# Patient Record
Sex: Female | Born: 1965 | Race: Black or African American | Hispanic: No | Marital: Married | Smoking: Never smoker
Health system: Southern US, Community
[De-identification: ages and names within clinical notes are randomized; demographics above are authoritative.]

## PROBLEM LIST (undated history)

## (undated) DIAGNOSIS — E042 Nontoxic multinodular goiter: Secondary | ICD-10-CM

## (undated) DIAGNOSIS — E785 Hyperlipidemia, unspecified: Secondary | ICD-10-CM

## (undated) DIAGNOSIS — I10 Essential (primary) hypertension: Secondary | ICD-10-CM

## (undated) HISTORY — DX: Hyperlipidemia, unspecified: E78.5

## (undated) HISTORY — DX: Nontoxic multinodular goiter: E04.2

## (undated) HISTORY — DX: Essential (primary) hypertension: I10

---

## 2009-09-08 HISTORY — PX: ABDOMINAL HYSTERECTOMY: SHX81

## 2010-03-18 ENCOUNTER — Ambulatory Visit: Payer: Self-pay | Admitting: Cardiology

## 2011-08-18 ENCOUNTER — Encounter: Payer: Self-pay | Admitting: Endocrinology

## 2011-08-18 ENCOUNTER — Ambulatory Visit (INDEPENDENT_AMBULATORY_CARE_PROVIDER_SITE_OTHER): Payer: 59 | Admitting: Endocrinology

## 2011-08-18 DIAGNOSIS — E042 Nontoxic multinodular goiter: Secondary | ICD-10-CM

## 2011-08-18 DIAGNOSIS — E0590 Thyrotoxicosis, unspecified without thyrotoxic crisis or storm: Secondary | ICD-10-CM

## 2011-08-18 NOTE — Patient Instructions (Signed)
let's check a thyroid "scan" (a special, but easy and painless type of thyroid x ray).  It works like this: you go to the x-ray department of the hospital to swallow a pill, which contains a miniscule amount of radiation.  You will not notice any symptoms from this.  You will go back to the x-ray department the next day, to lie down in front of a camera.  The results of this will be sent to me.  please call [phone] to hear your test results.  You will be prompted to enter the 9-digit "MRN" number that appears at the top left of this page, followed by #.  Then you will hear the message. Based on the results, i hope to order for you a treatment pill of radioactive iodine.  Although it is a larger amount of radiation, you will again notice no symptoms from this.  The pill is gone from your body in a few days (during which you should stay away from other people), but takes several months to work.  Therefore, please return here approximately 6-8 weeks after the treatment.  This treatment has been available for many years, and the only known side-effect is an underactive thyroid.  It is possible that i would eventually prescribe for you a thyroid hormone pill, which is very inexpensive.  You don't have to worry about side-effects of this thyroid hormone pill, because it is the same molecule your thyroid makes.    

## 2011-08-18 NOTE — Progress Notes (Signed)
  Subjective:    Patient ID: [name], female    DOB: 29-Oct-1965, 45 y.o.   MRN: [id number]  HPI Pt was noted in 2011 to have a goiter, in the eval of infection after ovarian surgery.  Since then, she notes slight swelling of the anterior neck, but no assoc pain.  She had a needle bx done in 2011--benign.  Past Medical History  Diagnosis Date  . Diabetes mellitus   . Nontoxic multinodular goiter   . Hyperlipidemia   . Hypertension     Past Surgical History  Procedure Date  . Abdominal hysterectomy 2011    History   Social History  . Marital Status: Married    Spouse Name: N/A    Number of Children: 2  . Years of Education: 12   Occupational History  . Not on file.   Social History Main Topics  . Smoking status: Never Smoker   . Smokeless tobacco: Not on file  . Alcohol Use: No  . Drug Use: No  . Sexually Active: Not on file   Other Topics Concern  . Not on file   Social History Narrative  . No narrative on file    No current outpatient prescriptions on file prior to visit.    No Known Allergies  Family History  Problem Relation Age of Onset  . Cancer Mother     Breast Cancer  . Cancer Father     Father-Thyroid cancer  no goiter or other thyroid probs  BP 132/88  Pulse 91  Temp(Src) 98.2 F (36.8 C) (Oral)  Ht 5\' 4"  (1.626 m)  Wt 236 lb 9.6 oz (107.321 kg)  BMI 40.61 kg/m2  SpO2 95%    Review of Systems denies hoarseness, double vision, anxiety, bruising, rhinorrhea.  She has fatigue, urinary frequency, myalgias, numbness, diarrhea, ,menopausal sxs, headache, tremor, excessive diaphoresis, weight gain, palpitations, and doe.    Objective:   Physical Exam VS: see vs page GEN: no distress HEAD: head: no deformity eyes: no periorbital swelling, no proptosis external nose and ears are normal mouth: no lesion seen NECK: supple, thyroid is not enlarged CHEST WALL: no deformity LUNGS:  Clear to auscultation CV: reg rate and rhythm, no  murmur ABD: abdomen is soft, nontender.  no hepatosplenomegaly.  not distended.  no hernia MUSCULOSKELETAL: muscle bulk and strength are grossly normal.  no obvious joint swelling.  gait is normal and steady EXTEMITIES: no deformity of the hands. no edema PULSES: dorsalis pedis intact bilat.  no carotid bruit NEURO:  cn 2-12 grossly intact.   readily moves all 4's.  sensation is intact to touch on the feet.  No tremor SKIN:  Normal texture and temperature.  No rash or suspicious lesion is visible.   NODES:  None palpable at the neck PSYCH: alert, oriented x3.  Does not appear anxious nor depressed.    outside test results are reviewed: i reviewed ultrasound result TSH=0.112    Assessment & Plan:  Multinodular goiter, which is usually hereditary Mild hyperthyroidism, due to the goiter.   Fatigue and other sxs--uncertain if thyroid-related

## 2011-08-20 ENCOUNTER — Encounter: Payer: Self-pay | Admitting: Endocrinology

## 2011-08-20 DIAGNOSIS — E785 Hyperlipidemia, unspecified: Secondary | ICD-10-CM | POA: Insufficient documentation

## 2011-08-20 DIAGNOSIS — I10 Essential (primary) hypertension: Secondary | ICD-10-CM | POA: Insufficient documentation

## 2011-09-03 ENCOUNTER — Encounter (HOSPITAL_COMMUNITY)
Admission: RE | Admit: 2011-09-03 | Discharge: 2011-09-03 | Disposition: A | Discharge status: Home / Self Care | Payer: 59 | Source: Ambulatory Visit | Attending: Endocrinology | Admitting: Endocrinology

## 2011-09-03 DIAGNOSIS — E042 Nontoxic multinodular goiter: Secondary | ICD-10-CM

## 2011-09-03 DIAGNOSIS — E0590 Thyrotoxicosis, unspecified without thyrotoxic crisis or storm: Secondary | ICD-10-CM

## 2011-09-04 ENCOUNTER — Encounter (HOSPITAL_COMMUNITY)
Admission: RE | Admit: 2011-09-04 | Discharge: 2011-09-04 | Disposition: A | Discharge status: Home / Self Care | Payer: 59 | Source: Ambulatory Visit | Attending: Endocrinology | Admitting: Endocrinology

## 2011-09-04 MED ORDER — SODIUM PERTECHNETATE TC 99M INJECTION
10.0000 | Freq: Once | INTRAVENOUS | Status: AC | PRN
  Administered 2011-09-04: 10 via INTRAVENOUS

## 2011-09-04 MED ORDER — SODIUM IODIDE I 131 CAPSULE
15.7000 | Freq: Once | INTRAVENOUS | Status: AC | PRN
  Administered 2011-09-03: 15.7 via ORAL

## 2011-09-05 ENCOUNTER — Other Ambulatory Visit: Payer: Self-pay | Admitting: Endocrinology

## 2011-09-05 DIAGNOSIS — E0590 Thyrotoxicosis, unspecified without thyrotoxic crisis or storm: Secondary | ICD-10-CM

## 2011-09-05 DIAGNOSIS — E042 Nontoxic multinodular goiter: Secondary | ICD-10-CM

## 2011-09-08 ENCOUNTER — Other Ambulatory Visit: Payer: Self-pay | Admitting: Endocrinology

## 2011-09-08 DIAGNOSIS — E0590 Thyrotoxicosis, unspecified without thyrotoxic crisis or storm: Secondary | ICD-10-CM

## 2011-09-18 ENCOUNTER — Encounter (HOSPITAL_COMMUNITY)
Admission: RE | Admit: 2011-09-18 | Discharge: 2011-09-18 | Disposition: A | Discharge status: Home / Self Care | Payer: 59 | Source: Ambulatory Visit | Attending: Endocrinology | Admitting: Endocrinology

## 2011-09-18 ENCOUNTER — Encounter (HOSPITAL_COMMUNITY): Payer: Self-pay

## 2011-09-18 DIAGNOSIS — E0520 Thyrotoxicosis with toxic multinodular goiter without thyrotoxic crisis or storm: Secondary | ICD-10-CM | POA: Insufficient documentation

## 2011-09-18 DIAGNOSIS — E0590 Thyrotoxicosis, unspecified without thyrotoxic crisis or storm: Secondary | ICD-10-CM

## 2011-09-18 MED ORDER — SODIUM IODIDE I 131 CAPSULE
31.0000 | Freq: Once | INTRAVENOUS | Status: AC | PRN
  Administered 2011-09-18: 31 via ORAL

## 2011-11-06 ENCOUNTER — Ambulatory Visit: Payer: 59 | Admitting: Endocrinology

## 2011-11-25 ENCOUNTER — Other Ambulatory Visit (INDEPENDENT_AMBULATORY_CARE_PROVIDER_SITE_OTHER): Payer: 59

## 2011-11-25 ENCOUNTER — Encounter: Payer: Self-pay | Admitting: Endocrinology

## 2011-11-25 ENCOUNTER — Ambulatory Visit (INDEPENDENT_AMBULATORY_CARE_PROVIDER_SITE_OTHER): Payer: 59 | Admitting: Endocrinology

## 2011-11-25 VITALS — BP 128/82 | HR 104 | Temp 98.4°F | Ht 64.0 in | Wt 229.2 lb

## 2011-11-25 DIAGNOSIS — E0590 Thyrotoxicosis, unspecified without thyrotoxic crisis or storm: Secondary | ICD-10-CM

## 2011-11-25 DIAGNOSIS — E042 Nontoxic multinodular goiter: Secondary | ICD-10-CM

## 2011-11-25 LAB — T4, FREE: Free T4: 1.71 ng/dL — ABNORMAL HIGH (ref 0.60–1.60)

## 2011-11-25 NOTE — Patient Instructions (Addendum)
blood tests are being requested for you today.  You will receive a letter with results. (see letter) 

## 2011-11-25 NOTE — Progress Notes (Signed)
  Subjective:    Patient ID: [name], female    DOB: February 10, 1966, 46 y.o.   MRN: [id number]  HPI Pt  Is 2 mos s/p i-131 rx for hyperthyroidism, due to multinodular goiter.  She still has heat intolerance Past Medical History  Diagnosis Date  . Nontoxic multinodular goiter   . Diabetes mellitus   . Hyperlipidemia   . Hypertension     Past Surgical History  Procedure Date  . Abdominal hysterectomy 2011    History   Social History  . Marital Status: Married    Spouse Name: N/A    Number of Children: 2  . Years of Education: 12   Occupational History  . Not on file.   Social History Main Topics  . Smoking status: Never Smoker   . Smokeless tobacco: Not on file  . Alcohol Use: No  . Drug Use: No  . Sexually Active: Not on file   Other Topics Concern  . Not on file   Social History Narrative  . No narrative on file    Current Outpatient Prescriptions on File Prior to Visit  Medication Sig Dispense Refill  . metFORMIN (GLUCOPHAGE) 500 MG tablet 3 tablets by mouth once daily       . rosuvastatin (CRESTOR) 10 MG tablet Take 10 mg by mouth daily.          No Known Allergies  Family History  Problem Relation Age of Onset  . Cancer Mother     Breast Cancer  . Cancer Father     Father-Thyroid cancer    BP 128/82  Pulse 104  Temp(Src) 98.4 F (36.9 C) (Oral)  Ht 5\' 4"  (1.626 m)  Wt 229 lb 3.2 oz (103.964 kg)  BMI 39.34 kg/m2  SpO2 95%   Review of Systems Sob is much less now    Objective:   Physical Exam VITAL SIGNS:  See vs page GENERAL: no distress eyes: bilateral proptosis Neck: 4 cm right thyroid nodule   Lab Results  Component Value Date   TSH 0.06* 11/25/2011      Assessment & Plan:  Multinodular goiter, unchanged Hyperthyroidism.  Not better yet.

## 2011-11-26 ENCOUNTER — Telehealth: Payer: Self-pay | Admitting: *Deleted

## 2011-11-26 NOTE — Telephone Encounter (Signed)
Called to inform pt of lab results-# listed does not accept calls from blocked numbers-mailed letter to pt's address on file.

## 2012-01-06 ENCOUNTER — Ambulatory Visit: Payer: 59 | Admitting: Endocrinology

## 2012-01-13 ENCOUNTER — Ambulatory Visit (INDEPENDENT_AMBULATORY_CARE_PROVIDER_SITE_OTHER): Payer: 59 | Admitting: Endocrinology

## 2012-01-13 ENCOUNTER — Encounter: Payer: Self-pay | Admitting: Endocrinology

## 2012-01-13 ENCOUNTER — Other Ambulatory Visit (INDEPENDENT_AMBULATORY_CARE_PROVIDER_SITE_OTHER): Payer: 59

## 2012-01-13 VITALS — BP 128/72 | HR 107 | Temp 97.7°F | Ht 64.0 in | Wt 228.0 lb

## 2012-01-13 DIAGNOSIS — E0590 Thyrotoxicosis, unspecified without thyrotoxic crisis or storm: Secondary | ICD-10-CM

## 2012-01-13 NOTE — Progress Notes (Signed)
  Subjective:    Patient ID: [name], female    DOB: 1966-02-04, 46 y.o.   MRN: [id number]  HPI Pt  Is 4 mos s/p i-131 rx for hyperthyroidism, due to multinodular goiter.  She has slight doe and fatigue. Past Medical History  Diagnosis Date  . Nontoxic multinodular goiter   . Diabetes mellitus   . Hyperlipidemia   . Hypertension     Past Surgical History  Procedure Date  . Abdominal hysterectomy 2011    History   Social History  . Marital Status: Married    Spouse Name: N/A    Number of Children: 2  . Years of Education: 12   Occupational History  . Not on file.   Social History Main Topics  . Smoking status: Never Smoker   . Smokeless tobacco: Not on file  . Alcohol Use: No  . Drug Use: No  . Sexually Active: Not on file   Other Topics Concern  . Not on file   Social History Narrative  . No narrative on file    Current Outpatient Prescriptions on File Prior to Visit  Medication Sig Dispense Refill  . metFORMIN (GLUCOPHAGE) 500 MG tablet 3 tablets by mouth once daily       . Olmesartan-Amlodipine-HCTZ 40-10-25 MG TABS Take 1 tablet by mouth daily.      . rosuvastatin (CRESTOR) 10 MG tablet Take 10 mg by mouth daily.          No Known Allergies  Family History  Problem Relation Age of Onset  . Cancer Mother     Breast Cancer  . Cancer Father     Father-Thyroid cancer    BP 128/72  Pulse 107  Temp(Src) 97.7 F (36.5 C) (Oral)  Ht 5\' 4"  (1.626 m)  Wt 228 lb (103.42 kg)  BMI 39.14 kg/m2  SpO2 97%  Review of Systems Denies weight change    Objective:   Physical Exam VITAL SIGNS:  See vs page GENERAL: no distress head: no deformity eyes: no periorbital swelling, but there is slight bilat proptosis. external nose and ears are normal mouth: no lesion seen Neck: 4 cm right thyroid nodule  Lab Results  Component Value Date   TSH 0.10* 01/13/2012      Assessment & Plan:  Hyperthyroidism, improved Multinodular goiter, improved.

## 2012-01-13 NOTE — Patient Instructions (Addendum)
blood tests are being requested for you today.  You will receive a letter with results. Please come back for a follow-up appointment in 2 months.  Please make an appointment. (update:  Ret as sched)

## 2012-01-14 ENCOUNTER — Encounter: Payer: Self-pay | Admitting: Endocrinology

## 2012-01-14 LAB — TSH: TSH: 0.1 u[IU]/mL — ABNORMAL LOW (ref 0.35–5.50)

## 2012-01-14 LAB — T4, FREE: Free T4: 1.02 ng/dL (ref 0.60–1.60)

## 2012-01-15 ENCOUNTER — Telehealth: Payer: Self-pay | Admitting: *Deleted

## 2012-01-15 NOTE — Telephone Encounter (Signed)
Called pt to inform of lab results. No answer/number busy. (Letter also mailed to pt)

## 2012-01-19 NOTE — Telephone Encounter (Signed)
Called pt to inform of lab results, unable to leave message-pt's # does not accept blocked calls-letter mailed to pt.

## 2012-01-19 NOTE — Telephone Encounter (Signed)
Called pt to inform of lab results, unable to leave message/no answer.

## 2012-03-16 ENCOUNTER — Other Ambulatory Visit (INDEPENDENT_AMBULATORY_CARE_PROVIDER_SITE_OTHER): Payer: 59

## 2012-03-16 ENCOUNTER — Encounter: Payer: Self-pay | Admitting: *Deleted

## 2012-03-16 ENCOUNTER — Encounter: Payer: Self-pay | Admitting: Endocrinology

## 2012-03-16 ENCOUNTER — Ambulatory Visit (INDEPENDENT_AMBULATORY_CARE_PROVIDER_SITE_OTHER): Payer: 59 | Admitting: Endocrinology

## 2012-03-16 VITALS — BP 128/80 | HR 105 | Temp 98.9°F | Ht 64.0 in | Wt 230.0 lb

## 2012-03-16 DIAGNOSIS — E0590 Thyrotoxicosis, unspecified without thyrotoxic crisis or storm: Secondary | ICD-10-CM

## 2012-03-16 NOTE — Progress Notes (Signed)
  Subjective:    Patient ID: [name], female    DOB: Feb 03, 1966, 46 y.o.   MRN: [id number]  HPI Pt  Is 6 mos s/p i-131 rx for hyperthyroidism, due to multinodular goiter.  She has slight doe and palpitations. Past Medical History  Diagnosis Date  . Nontoxic multinodular goiter   . Diabetes mellitus   . Hyperlipidemia   . Hypertension     Past Surgical History  Procedure Date  . Abdominal hysterectomy 2011    History   Social History  . Marital Status: Married    Spouse Name: N/A    Number of Children: 2  . Years of Education: 12   Occupational History  . Not on file.   Social History Main Topics  . Smoking status: Never Smoker   . Smokeless tobacco: Not on file  . Alcohol Use: No  . Drug Use: No  . Sexually Active: Not on file   Other Topics Concern  . Not on file   Social History Narrative  . No narrative on file    Current Outpatient Prescriptions on File Prior to Visit  Medication Sig Dispense Refill  . metFORMIN (GLUCOPHAGE) 500 MG tablet 3 tablets by mouth once daily       . Olmesartan-Amlodipine-HCTZ 40-10-25 MG TABS Take 1 tablet by mouth daily.      . rosuvastatin (CRESTOR) 10 MG tablet Take 10 mg by mouth daily.          No Known Allergies  Family History  Problem Relation Age of Onset  . Cancer Mother     Breast Cancer  . Cancer Father     Father-Thyroid cancer   BP 128/80  Pulse 105  Temp 98.9 F (37.2 C) (Oral)  Ht 5\' 4"  (1.626 m)  Wt 230 lb (104.327 kg)  BMI 39.48 kg/m2  SpO2 94%  Review of Systems Denies weight change    Objective:   Physical Exam head: no deformity eyes: no periorbital swelling, but there is slight bilat proptosis. external nose and ears are normal mouth: no lesion seen Neck: 4 cm right thyroid nodule.   Lab Results  Component Value Date   TSH 0.06* 03/16/2012      Assessment & Plan:  Hyperfunctioning thyroid adenoma.  It persists, anatomically and functionally, despite i-131 rx.

## 2012-03-16 NOTE — Patient Instructions (Addendum)
blood tests are being requested for you today.  You will receive a letter with results. Please come back for a follow-up appointment in 4 months.  Please make an appointment.  If your thyroid blood test is still abnormal, you should consider another radioactive iodine treatment pill. (update: i ordered rai uptake in preparation for repeat i-131 rx)

## 2012-03-17 LAB — TSH: TSH: 0.06 u[IU]/mL — ABNORMAL LOW (ref 0.35–5.50)

## 2012-03-30 ENCOUNTER — Other Ambulatory Visit: Payer: Self-pay | Admitting: Endocrinology

## 2012-03-30 DIAGNOSIS — E0590 Thyrotoxicosis, unspecified without thyrotoxic crisis or storm: Secondary | ICD-10-CM

## 2012-04-01 ENCOUNTER — Ambulatory Visit (HOSPITAL_COMMUNITY): Payer: 59

## 2012-04-02 ENCOUNTER — Other Ambulatory Visit (HOSPITAL_COMMUNITY): Payer: 59

## 2012-07-01 ENCOUNTER — Other Ambulatory Visit: Payer: Self-pay | Admitting: Endocrinology

## 2012-07-01 DIAGNOSIS — E0590 Thyrotoxicosis, unspecified without thyrotoxic crisis or storm: Secondary | ICD-10-CM

## 2012-07-13 ENCOUNTER — Ambulatory Visit (INDEPENDENT_AMBULATORY_CARE_PROVIDER_SITE_OTHER): Payer: 59 | Admitting: Endocrinology

## 2012-07-13 ENCOUNTER — Encounter: Payer: Self-pay | Admitting: Endocrinology

## 2012-07-13 VITALS — BP 126/82 | HR 78 | Temp 97.8°F | Wt 230.0 lb

## 2012-07-13 DIAGNOSIS — E0590 Thyrotoxicosis, unspecified without thyrotoxic crisis or storm: Secondary | ICD-10-CM

## 2012-07-13 NOTE — Patient Instructions (Addendum)
let's recheck the thyroid "scan" (a special, but easy and painless type of thyroid x ray).  It works like this: you go to the x-ray department of the hospital to swallow a pill, which contains a miniscule amount of radiation.  You will not notice any symptoms from this.  You will go back to the x-ray department the next day, to lie down in front of a camera.  The results of this will be sent to me.   Based on the results, i hope to order for you a treatment pill of radioactive iodine.  Although it is a larger amount of radiation, you will again notice no symptoms from this.  The pill is gone from your body in a few days (during which you should stay away from other people), but takes several months to work.  Therefore, please return here approximately 6-8 weeks after the treatment.  This treatment has been available for many years, and the only known side-effect is an underactive thyroid.  It is possible that i would eventually prescribe for you a thyroid hormone pill, which is very inexpensive.  You don't have to worry about side-effects of this thyroid hormone pill, because it is the same molecule your thyroid makes.

## 2012-07-13 NOTE — Progress Notes (Signed)
  Subjective:    Patient ID: [name], female    DOB: 02-22-1966, 46 y.o.   MRN: [id number]  HPI Pt  Is 10 mos s/p i-131 rx for hyperthyroidism, due to multinodular goiter.  sxs of doe and excessive diaphoresis persist.  nuc med scan was ordered a few mos ago, but pt says she was not called.  In our contact info, we have her home ph#, but not her cell phone. Past Medical History  Diagnosis Date  . Nontoxic multinodular goiter   . Diabetes mellitus   . Hyperlipidemia   . Hypertension     Past Surgical History  Procedure Date  . Abdominal hysterectomy 2011    History   Social History  . Marital Status: Married    Spouse Name: N/A    Number of Children: 2  . Years of Education: 12   Occupational History  . Not on file.   Social History Main Topics  . Smoking status: Never Smoker   . Smokeless tobacco: Not on file  . Alcohol Use: No  . Drug Use: No  . Sexually Active: Not on file   Other Topics Concern  . Not on file   Social History Narrative  . No narrative on file    Current Outpatient Prescriptions on File Prior to Visit  Medication Sig Dispense Refill  . Liraglutide (VICTOZA [place]) Inject into the skin.      . metFORMIN (GLUCOPHAGE) 500 MG tablet 3 tablets by mouth once daily       . Olmesartan-Amlodipine-HCTZ 40-10-25 MG TABS Take 1 tablet by mouth daily.      . rosuvastatin (CRESTOR) 10 MG tablet Take 10 mg by mouth daily.          No Known Allergies  Family History  Problem Relation Age of Onset  . Cancer Mother     Breast Cancer  . Cancer Father     Father-Thyroid cancer    BP 126/82  Pulse 78  Temp 97.8 F (36.6 C) (Oral)  Wt 230 lb (104.327 kg)  SpO2 94%   Review of Systems Denies fever    Objective:   Physical Exam VITAL SIGNS:  See vs page GENERAL: no distress Neck: thyroid is 5-10x normal size overall, with multinodular surface.  Most prominent is a 4 cm right thyroid nodule.      Assessment & Plan:  Hyperthyroidism,  persistent after i-131 rx.  i have added pt's cell phone to her contact info.

## 2012-07-14 ENCOUNTER — Telehealth: Payer: Self-pay | Admitting: *Deleted

## 2012-07-14 NOTE — Telephone Encounter (Signed)
Called pt to let her know that I spoke with Nuclear Medicine at [facility] about her appointments next week on 11/12 and 11/13. Told pt the first day she will go in to swallow a pill and then you will return the next day to have the imaging done of her thyroid. Pt understood.

## 2012-07-20 ENCOUNTER — Ambulatory Visit (HOSPITAL_COMMUNITY)
Admission: RE | Admit: 2012-07-20 | Discharge: 2012-07-20 | Disposition: A | Discharge status: Home / Self Care | Payer: 59 | Source: Ambulatory Visit | Attending: Endocrinology | Admitting: Endocrinology

## 2012-07-20 DIAGNOSIS — E0590 Thyrotoxicosis, unspecified without thyrotoxic crisis or storm: Secondary | ICD-10-CM

## 2012-07-21 ENCOUNTER — Other Ambulatory Visit: Payer: Self-pay | Admitting: Endocrinology

## 2012-07-21 ENCOUNTER — Encounter (HOSPITAL_COMMUNITY)
Admission: RE | Admit: 2012-07-21 | Discharge: 2012-07-21 | Disposition: A | Discharge status: Home / Self Care | Payer: 59 | Source: Ambulatory Visit | Attending: Endocrinology | Admitting: Endocrinology

## 2012-07-21 DIAGNOSIS — E042 Nontoxic multinodular goiter: Secondary | ICD-10-CM | POA: Insufficient documentation

## 2012-07-21 DIAGNOSIS — E0590 Thyrotoxicosis, unspecified without thyrotoxic crisis or storm: Secondary | ICD-10-CM

## 2012-07-21 MED ORDER — SODIUM PERTECHNETATE TC 99M INJECTION
10.7000 | Freq: Once | INTRAVENOUS | Status: AC | PRN
  Administered 2012-07-21: 11 via INTRAVENOUS

## 2012-07-21 MED ORDER — SODIUM IODIDE I 131 CAPSULE
6.9400 | Freq: Once | INTRAVENOUS | Status: AC | PRN
  Administered 2012-07-20: 6.94 via ORAL

## 2012-07-30 ENCOUNTER — Encounter (HOSPITAL_COMMUNITY)
Admission: RE | Admit: 2012-07-30 | Discharge: 2012-07-30 | Disposition: A | Discharge status: Home / Self Care | Payer: 59 | Source: Ambulatory Visit | Attending: Endocrinology | Admitting: Endocrinology

## 2012-07-30 DIAGNOSIS — E0590 Thyrotoxicosis, unspecified without thyrotoxic crisis or storm: Secondary | ICD-10-CM | POA: Insufficient documentation

## 2012-07-30 MED ORDER — SODIUM IODIDE I 131 CAPSULE
30.8000 | Freq: Once | INTRAVENOUS | Status: AC | PRN
  Administered 2012-07-30: 30.8 via ORAL

## 2012-09-13 ENCOUNTER — Ambulatory Visit: Payer: 59 | Admitting: Endocrinology

## 2012-10-01 ENCOUNTER — Ambulatory Visit (INDEPENDENT_AMBULATORY_CARE_PROVIDER_SITE_OTHER): Payer: 59 | Admitting: Endocrinology

## 2012-10-01 ENCOUNTER — Encounter: Payer: Self-pay | Admitting: Endocrinology

## 2012-10-01 VITALS — BP 138/76 | HR 109 | Wt 235.0 lb

## 2012-10-01 DIAGNOSIS — E0590 Thyrotoxicosis, unspecified without thyrotoxic crisis or storm: Secondary | ICD-10-CM

## 2012-10-01 LAB — TSH: TSH: 9.356 u[IU]/mL — ABNORMAL HIGH (ref 0.350–4.500)

## 2012-10-01 NOTE — Progress Notes (Signed)
  Subjective:    Patient ID: [name], female    DOB: 1965-11-09, 47 y.o.   MRN: [id number]  HPI Pt returns for f/u of hyperthyroidism, due to multinodular goiter.  She had her second dose of i-131 in November of 2013.  She says she's feeling better in general.  She says despite the recent cold weather, she does not have as much cold intolerance.   Past Medical History  Diagnosis Date  . Nontoxic multinodular goiter   . Diabetes mellitus   . Hyperlipidemia   . Hypertension     Past Surgical History  Procedure Date  . Abdominal hysterectomy 2011    History   Social History  . Marital Status: Married    Spouse Name: N/A    Number of Children: 2  . Years of Education: 12   Occupational History  . Not on file.   Social History Main Topics  . Smoking status: Never Smoker   . Smokeless tobacco: Not on file  . Alcohol Use: No  . Drug Use: No  . Sexually Active: Not on file   Other Topics Concern  . Not on file   Social History Narrative  . No narrative on file    Current Outpatient Prescriptions on File Prior to Visit  Medication Sig Dispense Refill  . Liraglutide (VICTOZA [place]) Inject into the skin.      . Liraglutide (VICTOZA) 18 MG/3ML SOLN Inject into the skin.      . metFORMIN (GLUCOPHAGE) 500 MG tablet 3 tablets by mouth once daily       . Olmesartan-Amlodipine-HCTZ 40-10-25 MG TABS Take 1 tablet by mouth daily.      . rosuvastatin (CRESTOR) 10 MG tablet Take 10 mg by mouth daily.          No Known Allergies  Family History  Problem Relation Age of Onset  . Cancer Mother     Breast Cancer  . Cancer Father     Father-Thyroid cancer    BP 138/76  Pulse 109  Wt 235 lb (106.595 kg)  SpO2 96%  Review of Systems Denies weight change    Objective:   Physical Exam VITAL SIGNS:  See vs page GENERAL: no distress Neck: thyroid is 5x normal size overall, with multinodular surface.  Most prominent is a 4 cm right thyroid nodule, near the isthmus.     Assessment & Plan:  S/p second i-131 rx for hyperthyroidism, due to multinodular goiter.  The goiter is smaller now.

## 2012-10-01 NOTE — Patient Instructions (Addendum)
blood tests are being requested for you today.  We'll contact you with results. Please come back for a follow-up appointment in 6 weeks.   

## 2012-10-02 ENCOUNTER — Other Ambulatory Visit: Payer: Self-pay | Admitting: Endocrinology

## 2012-10-02 MED ORDER — LEVOTHYROXINE SODIUM 50 MCG PO TABS: 50.0000 ug | ORAL_TABLET | Freq: Every day | ORAL | Status: DC

## 2012-11-12 ENCOUNTER — Ambulatory Visit: Payer: 59 | Admitting: Endocrinology

## 2012-12-10 ENCOUNTER — Encounter: Payer: Self-pay | Admitting: Endocrinology

## 2012-12-10 ENCOUNTER — Ambulatory Visit (INDEPENDENT_AMBULATORY_CARE_PROVIDER_SITE_OTHER): Payer: 59 | Admitting: Endocrinology

## 2012-12-10 VITALS — BP 126/76 | HR 76 | Wt 244.0 lb

## 2012-12-10 DIAGNOSIS — E042 Nontoxic multinodular goiter: Secondary | ICD-10-CM

## 2012-12-10 MED ORDER — LEVOTHYROXINE SODIUM 75 MCG PO TABS: 75.0000 ug | ORAL_TABLET | Freq: Every day | ORAL | Status: DC

## 2012-12-10 NOTE — Progress Notes (Signed)
  Subjective:    Patient ID: [name], female    DOB: December 19, 1965, 47 y.o.   MRN: [id number]  HPI Pt returns for f/u of hyperthyroidism, due to multinodular goiter.  She had her second dose of i-131 in November of 2013.  She says she's feeling no different, and well in general. At last ov, she was rx'ed synthroid for a high tsh, but she does not take it.   Past Medical History  Diagnosis Date  . Nontoxic multinodular goiter   . Diabetes mellitus   . Hyperlipidemia   . Hypertension     Past Surgical History  Procedure Laterality Date  . Abdominal hysterectomy  2011    History   Social History  . Marital Status: Married    Spouse Name: N/A    Number of Children: 2  . Years of Education: 12   Occupational History  . Not on file.   Social History Main Topics  . Smoking status: Never Smoker   . Smokeless tobacco: Not on file  . Alcohol Use: No  . Drug Use: No  . Sexually Active: Not on file   Other Topics Concern  . Not on file   Social History Narrative  . No narrative on file    Current Outpatient Prescriptions on File Prior to Visit  Medication Sig Dispense Refill  . Liraglutide (VICTOZA) 18 MG/3ML SOLN Inject 1.2 mg into the skin daily.       . metFORMIN (GLUCOPHAGE) 500 MG tablet 3 tablets by mouth once daily       . Olmesartan-Amlodipine-HCTZ 40-10-25 MG TABS Take 1 tablet by mouth daily.      . rosuvastatin (CRESTOR) 10 MG tablet Take 10 mg by mouth daily.         No current facility-administered medications on file prior to visit.    No Known Allergies  Family History  Problem Relation Age of Onset  . Cancer Mother     Breast Cancer  . Cancer Father     Father-Thyroid cancer    BP 126/76  Pulse 76  Wt 244 lb (110.678 kg)  BMI 41.86 kg/m2  SpO2 98%   Review of Systems Denies weight change    Objective:   Physical Exam VITAL SIGNS:  See vs page GENERAL: no distress Neck: thyroid is 5x normal size overall, with multinodular surface.   Most prominent is a 4 cm right thyroid nodule, near the isthmus.    Lab Results  Component Value Date   TSH 27.67* 12/10/2012      Assessment & Plan:  Post-i-131 hypothyroidism, rx needed

## 2012-12-10 NOTE — Patient Instructions (Addendum)
blood tests are being requested for you today.  We'll contact you with results. Please come back for a follow-up appointment in 3 months.   most of the time, a "lumpy thyroid" will eventually become overactive again.  this is usually a slow process, happening over the span of many years.  

## 2012-12-30 DIAGNOSIS — R079 Chest pain, unspecified: Secondary | ICD-10-CM

## 2013-03-30 ENCOUNTER — Encounter: Payer: Self-pay | Admitting: Endocrinology

## 2013-03-30 ENCOUNTER — Ambulatory Visit (INDEPENDENT_AMBULATORY_CARE_PROVIDER_SITE_OTHER): Payer: 59 | Admitting: Endocrinology

## 2013-03-30 VITALS — BP 118/70 | HR 96 | Temp 97.6°F | Ht 63.5 in | Wt 245.5 lb

## 2013-03-30 DIAGNOSIS — E890 Postprocedural hypothyroidism: Secondary | ICD-10-CM

## 2013-03-30 LAB — TSH: TSH: 12.78 u[IU]/mL — ABNORMAL HIGH (ref 0.35–5.50)

## 2013-03-30 MED ORDER — LEVOTHYROXINE SODIUM 100 MCG PO TABS: 100.0000 ug | ORAL_TABLET | Freq: Every day | ORAL | Status: DC

## 2013-03-30 NOTE — Patient Instructions (Addendum)
A thyroid blood test is requested for you today.  We'll contact you with results. Please come back for a follow-up appointment in 3 months.   most of the time, a "lumpy thyroid" will eventually become overactive again.  this is usually a slow process, happening over the span of many years.

## 2013-03-30 NOTE — Progress Notes (Signed)
  Subjective:    Patient ID: [name], female    DOB: 1965/11/01, 47 y.o.   MRN: [id number]  HPI Pt returns for f/u of hyperthyroidism, due to multinodular goiter.  She had her second dose of i-131 in November of 2013.  She has been on synthroid since April of 2014,  Since then she's feeling no different, and well in general.   Past Medical History  Diagnosis Date  . Nontoxic multinodular goiter   . Diabetes mellitus   . Hyperlipidemia   . Hypertension     Past Surgical History  Procedure Laterality Date  . Abdominal hysterectomy  2011    History   Social History  . Marital Status: Married    Spouse Name: N/A    Number of Children: 2  . Years of Education: 12   Occupational History  . Not on file.   Social History Main Topics  . Smoking status: Never Smoker   . Smokeless tobacco: Not on file  . Alcohol Use: No  . Drug Use: No  . Sexually Active: Not on file   Other Topics Concern  . Not on file   Social History Narrative  . No narrative on file    Current Outpatient Prescriptions on File Prior to Visit  Medication Sig Dispense Refill  . Liraglutide (VICTOZA) 18 MG/3ML SOLN Inject 1.2 mg into the skin daily.       . metFORMIN (GLUCOPHAGE) 500 MG tablet 3 tablets by mouth once daily       . Olmesartan-Amlodipine-HCTZ 40-10-25 MG TABS Take 1 tablet by mouth daily.      . rosuvastatin (CRESTOR) 10 MG tablet Take 10 mg by mouth daily.         No current facility-administered medications on file prior to visit.    No Known Allergies  Family History  Problem Relation Age of Onset  . Cancer Mother     Breast Cancer  . Cancer Father     Father-Thyroid cancer    BP 118/70  Pulse 96  Temp(Src) 97.6 F (36.4 C) (Oral)  Ht 5' 3.5" (1.613 m)  Wt 245 lb 8 oz (111.358 kg)  BMI 42.8 kg/m2  SpO2 95%   Review of Systems Denies weight change.     Objective:   Physical Exam head: no deformity eyes: no periorbital swelling; slight bilateral  proptosis external nose and ears are normal Neck: thyroid is 5x normal size overall, with multinodular surface.  Most prominent is an approx 4 cm right thyroid nodule, near the isthmus.   Lab Results  Component Value Date   TSH 12.78* 03/30/2013      Assessment & Plan:  Post-i-131 hypothyroidism, needs increased rx

## 2013-07-01 ENCOUNTER — Encounter: Payer: Self-pay | Admitting: Endocrinology

## 2013-07-01 ENCOUNTER — Ambulatory Visit (INDEPENDENT_AMBULATORY_CARE_PROVIDER_SITE_OTHER): Payer: 59 | Admitting: Endocrinology

## 2013-07-01 VITALS — BP 136/80 | HR 100 | Wt 246.0 lb

## 2013-07-01 DIAGNOSIS — E890 Postprocedural hypothyroidism: Secondary | ICD-10-CM

## 2013-07-01 MED ORDER — LEVOTHYROXINE SODIUM 125 MCG PO TABS: 125.0000 ug | ORAL_TABLET | Freq: Every day | ORAL | Status: DC

## 2013-07-01 NOTE — Progress Notes (Signed)
  Subjective:    Patient ID: [name], female    DOB: 03-17-1966, 47 y.o.   MRN: [id number]  HPI Pt was noted in 2011 to have a multinodular goiter, in the eval of infection after ovarian surgery.  For associated hyperthyroidism, she had i-131 rx twice in 2013 (January and November).  She has been on synthroid since April of 2014,  She reports heat intolerance, but no tremor.   Past Medical History  Diagnosis Date  . Nontoxic multinodular goiter   . Diabetes mellitus   . Hyperlipidemia   . Hypertension     Past Surgical History  Procedure Laterality Date  . Abdominal hysterectomy  2011    History   Social History  . Marital Status: Married    Spouse Name: N/A    Number of Children: 2  . Years of Education: 12   Occupational History  . Not on file.   Social History Main Topics  . Smoking status: Never Smoker   . Smokeless tobacco: Not on file  . Alcohol Use: No  . Drug Use: No  . Sexual Activity: Not on file   Other Topics Concern  . Not on file   Social History Narrative  . No narrative on file    Current Outpatient Prescriptions on File Prior to Visit  Medication Sig Dispense Refill  . Liraglutide (VICTOZA) 18 MG/3ML SOLN Inject 1.2 mg into the skin daily.       . metFORMIN (GLUCOPHAGE) 500 MG tablet 3 tablets by mouth once daily       . Olmesartan-Amlodipine-HCTZ 40-10-25 MG TABS Take 1 tablet by mouth daily.      . rosuvastatin (CRESTOR) 10 MG tablet Take 10 mg by mouth daily.         No current facility-administered medications on file prior to visit.   No Known Allergies  Family History  Problem Relation Age of Onset  . Cancer Mother     Breast Cancer  . Cancer Father     Father-Thyroid cancer   BP 136/80  Pulse 100  Wt 246 lb (111.585 kg)  BMI 42.89 kg/m2  SpO2 97%  Review of Systems Denies weight change.    Objective:   Physical Exam VITAL SIGNS:  See vs page GENERAL: no distress Eyes: mild bilat proptosis.   Neck: thyroid is 5x  normal size overall, with multinodular surface.  Most prominent is an approx 4 cm right thyroid nodule, near the isthmus.    Lab Results  Component Value Date   TSH 23.38* 07/01/2013      Assessment & Plan:  Post-i-131 hypothyroidism.  She needs increased rx Large multinodular goiter.  Clinically unchanged with i-131 rx.

## 2013-07-01 NOTE — Patient Instructions (Signed)
A thyroid blood test is requested for you today.  We'll contact you with results. Please come back for a follow-up appointment in 3 months.

## 2013-07-14 ENCOUNTER — Other Ambulatory Visit: Payer: Self-pay

## 2013-09-08 DIAGNOSIS — I639 Cerebral infarction, unspecified: Secondary | ICD-10-CM

## 2013-09-08 HISTORY — DX: Cerebral infarction, unspecified: I63.9

## 2013-10-07 ENCOUNTER — Ambulatory Visit (INDEPENDENT_AMBULATORY_CARE_PROVIDER_SITE_OTHER): Payer: 59 | Admitting: Endocrinology

## 2013-10-07 ENCOUNTER — Encounter: Payer: Self-pay | Admitting: Endocrinology

## 2013-10-07 VITALS — BP 114/80 | HR 100 | Temp 98.4°F | Ht 63.0 in | Wt 245.0 lb

## 2013-10-07 DIAGNOSIS — E890 Postprocedural hypothyroidism: Secondary | ICD-10-CM

## 2013-10-07 LAB — TSH: TSH: 2.38 u[IU]/mL (ref 0.35–5.50)

## 2013-10-07 NOTE — Progress Notes (Signed)
   Subjective:    Patient ID: [name], female    DOB: 02/28/1966, 48 y.o.   MRN: [id number]  HPI Pt was noted in 2011 to have a multinodular goiter, in the eval of infection after ovarian surgery.  For associated hyperthyroidism, she had i-131 rx twice in 2013 (January and November).  She has been on synthroid since April of 2014.  She reports heat intolerance.  She says she takes the synthroid as rx'ed.    Past Medical History  Diagnosis Date  . Nontoxic multinodular goiter   . Diabetes mellitus   . Hyperlipidemia   . Hypertension     Past Surgical History  Procedure Laterality Date  . Abdominal hysterectomy  2011    History   Social History  . Marital Status: Married    Spouse Name: N/A    Number of Children: 2  . Years of Education: 12   Occupational History  . Not on file.   Social History Main Topics  . Smoking status: Never Smoker   . Smokeless tobacco: Not on file  . Alcohol Use: No  . Drug Use: No  . Sexual Activity: Not on file   Other Topics Concern  . Not on file   Social History Narrative  . No narrative on file    Current Outpatient Prescriptions on File Prior to Visit  Medication Sig Dispense Refill  . levothyroxine (SYNTHROID, LEVOTHROID) 125 MCG tablet Take 1 tablet (125 mcg total) by mouth daily before breakfast.  30 tablet  5  . Liraglutide (VICTOZA) 18 MG/3ML SOLN Inject 1.2 mg into the skin daily.       . metFORMIN (GLUCOPHAGE) 500 MG tablet 3 tablets by mouth once daily       . Olmesartan-Amlodipine-HCTZ 40-10-25 MG TABS Take 1 tablet by mouth daily.      . rosuvastatin (CRESTOR) 10 MG tablet Take 10 mg by mouth daily.         No current facility-administered medications on file prior to visit.    No Known Allergies  Family History  Problem Relation Age of Onset  . Cancer Mother     Breast Cancer  . Cancer Father     Father-Thyroid cancer    BP 114/80  Pulse 100  Temp(Src) 98.4 F (36.9 C) (Oral)  Ht 5\' 3"  (1.6 m)  Wt  245 lb (111.131 kg)  BMI 43.41 kg/m2  SpO2 91%  Review of Systems Denies tremor.      Objective:   Physical Exam VITAL SIGNS:  See vs page GENERAL: no distress Neck: thyroid is 5x normal size overall, with multinodular surface.  Most prominent is an approx 4 cm right thyroid nodule, near the isthmus.    Lab Results  Component Value Date   TSH 2.38 10/07/2013      Assessment & Plan:  Post-i-131 hypothyroidism, well-replaced. Large multinodular goiter.  Clinically unchanged with i-131 rx

## 2013-10-07 NOTE — Patient Instructions (Signed)
A thyroid blood test is requested for you today.  We'll contact you with results. Please come back for a follow-up appointment in 4 months.   

## 2014-02-08 ENCOUNTER — Encounter: Payer: Self-pay | Admitting: Endocrinology

## 2014-02-08 ENCOUNTER — Ambulatory Visit (INDEPENDENT_AMBULATORY_CARE_PROVIDER_SITE_OTHER): Payer: 59 | Admitting: Endocrinology

## 2014-02-08 VITALS — BP 124/78 | HR 101 | Temp 98.3°F | Ht 63.0 in | Wt 242.0 lb

## 2014-02-08 DIAGNOSIS — E890 Postprocedural hypothyroidism: Secondary | ICD-10-CM

## 2014-02-08 LAB — TSH: TSH: 8.68 u[IU]/mL — ABNORMAL HIGH (ref 0.35–4.50)

## 2014-02-08 MED ORDER — LEVOTHYROXINE SODIUM 137 MCG PO TABS: 137.0000 ug | ORAL_TABLET | Freq: Every day | ORAL | Status: DC

## 2014-02-08 NOTE — Patient Instructions (Addendum)
A thyroid blood test is requested for you today.  We'll contact you with results.   Please come back for a follow-up appointment in 4-6 months.  most of the time, a "lumpy thyroid" will eventually become overactive again.  this is usually a slow process, happening over the span of many years.

## 2014-02-08 NOTE — Progress Notes (Signed)
   Subjective:    Patient ID: [name], female    DOB: [birth year], 48 y.o.   MRN: [id number]  HPI Pt returns for f/u of a large multinodular goiter (dx'ed 2011, in the eval of infection after ovarian surgery; for associated hyperthyroidism, she had i-131 rx twice in 2013; she has been on synthroid since April of 2014).  She still reports heat intolerance.  She says she takes the synthroid as rx'ed.  She denies neck pain, or assoc swelling.  Past Medical History  Diagnosis Date  . Nontoxic multinodular goiter   . Diabetes mellitus   . Hyperlipidemia   . Hypertension     Past Surgical History  Procedure Laterality Date  . Abdominal hysterectomy  2011    History   Social History  . Marital Status: Married    Spouse Name: N/A    Number of Children: 2  . Years of Education: 12   Occupational History  . Not on file.   Social History Main Topics  . Smoking status: Never Smoker   . Smokeless tobacco: Not on file  . Alcohol Use: No  . Drug Use: No  . Sexual Activity: Not on file   Other Topics Concern  . Not on file   Social History Narrative  . No narrative on file    Current Outpatient Prescriptions on File Prior to Visit  Medication Sig Dispense Refill  . Liraglutide (VICTOZA) 18 MG/3ML SOLN Inject 1.2 mg into the skin daily.       . metFORMIN (GLUCOPHAGE) 500 MG tablet 3 tablets by mouth once daily       . Olmesartan-Amlodipine-HCTZ 40-10-25 MG TABS Take 1 tablet by mouth daily.      . rosuvastatin (CRESTOR) 10 MG tablet Take 10 mg by mouth daily.         No current facility-administered medications on file prior to visit.    No Known Allergies  Family History  Problem Relation Age of Onset  . Cancer Mother     Breast Cancer  . Cancer Father     Father-Thyroid cancer    BP 124/78  Pulse 101  Temp(Src) 98.3 F (36.8 C) (Oral)  Ht 5\' 3"  (1.6 m)  Wt 242 lb (109.77 kg)  BMI 42.88 kg/m2  SpO2 94%  Review of Systems Denies weight change and  dysphagia.      Objective:   Physical Exam VITAL SIGNS:  See vs page GENERAL: no distress Neck: thyroid is 5x normal size overall, with multinodular surface.  Most prominent is an approx 4 cm right thyroid nodule, near the isthmus.    Lab Results  Component Value Date   TSH 8.68* 02/08/2014  (i reviewed 2012 thyroid US report)     Assessment & Plan:  Post-I-131 hypothyroidism: mild exacerbation: she needs increased rx Multinodular goiter: large, but clinically unchanged.   Patient is advised the following: Patient Instructions  A thyroid blood test is requested for you today.  We'll contact you with results.   Please come back for a follow-up appointment in 4-6 months.  most of the time, a "lumpy thyroid" will eventually become overactive again.  this is usually a slow process, happening over the span of many years.

## 2014-08-09 ENCOUNTER — Encounter: Payer: Self-pay | Admitting: Endocrinology

## 2014-08-09 ENCOUNTER — Ambulatory Visit (INDEPENDENT_AMBULATORY_CARE_PROVIDER_SITE_OTHER): Payer: 59 | Admitting: Endocrinology

## 2014-08-09 VITALS — BP 134/94 | HR 112 | Temp 97.8°F | Ht 63.0 in | Wt 241.0 lb

## 2014-08-09 DIAGNOSIS — E042 Nontoxic multinodular goiter: Secondary | ICD-10-CM

## 2014-08-09 NOTE — Patient Instructions (Addendum)
Please do your thyroid blood test at [facility] tomorrow.  We'll contact you with results.   Please come back for a follow-up appointment in 6 months.  [facility].  you will receive a phone call, about a day and time for an appointment most of the time, a "lumpy thyroid" will eventually become overactive again.  this is usually a slow process, happening over the span of many years.   Please see Dr Leandrew KoyanagiBurdine tomorrow, to recheck your heart rate, which runs fast here.

## 2014-08-09 NOTE — Progress Notes (Signed)
   Subjective:    Patient ID: [name], female    DOB: June 30, 1966, 48 y.o.   MRN: [id number]  HPI Pt returns for f/u of a large multinodular goiter (dx'ed 2011, in the eval of infection after ovarian surgery; for associated hyperthyroidism, she had i-131 rx twice in 2013; she has been on synthroid since April of 2014).  She still reports heat intolerance.  She does not notice the goiter.    Past Medical History  Diagnosis Date  . Nontoxic multinodular goiter   . Diabetes mellitus   . Hyperlipidemia   . Hypertension     Past Surgical History  Procedure Laterality Date  . Abdominal hysterectomy  2011    History   Social History  . Marital Status: Married    Spouse Name: N/A    Number of Children: 2  . Years of Education: 12   Occupational History  . Not on file.   Social History Main Topics  . Smoking status: Never Smoker   . Smokeless tobacco: Not on file  . Alcohol Use: No  . Drug Use: No  . Sexual Activity: Not on file   Other Topics Concern  . Not on file   Social History Narrative    Current Outpatient Prescriptions on File Prior to Visit  Medication Sig Dispense Refill  . INVOKANA 100 MG TABS     . levothyroxine (SYNTHROID, LEVOTHROID) 137 MCG tablet Take 1 tablet (137 mcg total) by mouth daily before breakfast. 30 tablet 11  . Liraglutide (VICTOZA) 18 MG/3ML SOLN Inject 1.2 mg into the skin daily.     . metFORMIN (GLUCOPHAGE) 500 MG tablet 3 tablets by mouth once daily     . Olmesartan-Amlodipine-HCTZ 40-10-25 MG TABS Take 1 tablet by mouth daily.    . rosuvastatin (CRESTOR) 10 MG tablet Take 10 mg by mouth daily.       No current facility-administered medications on file prior to visit.    No Known Allergies  Family History  Problem Relation Age of Onset  . Cancer Mother     Breast Cancer  . Cancer Father     Father-Thyroid cancer    BP 134/94 mmHg  Pulse 112  Temp(Src) 97.8 F (36.6 C) (Oral)  Ht 5\' 3"  (1.6 m)  Wt 241 lb (109.317 kg)   BMI 42.70 kg/m2  SpO2 95%    Review of Systems Denies weight change.      Objective:   Physical Exam VITAL SIGNS:  See vs page.   GENERAL: no distress. Neck: thyroid is 3-5x normal size overall, with multinodular surface.          Assessment & Plan:  Post-I-131 hypothyroidism, on rx Multinodular goiter, clinically unchanged.  Patient is advised the following: Patient Instructions  Please do your thyroid blood test at [facility] tomorrow.  We'll contact you with results.   Please come back for a follow-up appointment in 6 months.  [facility].  you will receive a phone call, about a day and time for an appointment most of the time, a "lumpy thyroid" will eventually become overactive again.  this is usually a slow process, happening over the span of many years.   Please see Dr Leandrew KoyanagiBurdine tomorrow, to recheck your heart rate, which runs fast here.

## 2014-09-11 ENCOUNTER — Telehealth: Payer: Self-pay | Admitting: Endocrinology

## 2014-09-11 NOTE — Telephone Encounter (Signed)
please call patient: i got [place] results.  Nodules are smaller--good. i'll see you next time.

## 2014-09-12 NOTE — Telephone Encounter (Signed)
Requested call back to discuss.  

## 2014-09-12 NOTE — Telephone Encounter (Signed)
Pt advised of note below and voiced understanding.  

## 2014-09-12 NOTE — Telephone Encounter (Signed)
Requested Call back to discuss.

## 2014-09-25 ENCOUNTER — Encounter: Payer: Self-pay | Admitting: Endocrinology

## 2015-02-08 ENCOUNTER — Encounter: Payer: Self-pay | Admitting: Endocrinology

## 2015-02-08 ENCOUNTER — Ambulatory Visit (INDEPENDENT_AMBULATORY_CARE_PROVIDER_SITE_OTHER): Payer: Self-pay | Admitting: Endocrinology

## 2015-02-08 VITALS — BP 134/88 | HR 110 | Temp 98.3°F | Ht 63.0 in | Wt 242.0 lb

## 2015-02-08 DIAGNOSIS — E890 Postprocedural hypothyroidism: Secondary | ICD-10-CM

## 2015-02-08 LAB — TSH: TSH: 2.75 u[IU]/mL (ref 0.35–4.50)

## 2015-02-08 NOTE — Progress Notes (Signed)
Subjective:    Patient ID: [name], female    DOB: Aug 21, 1966, 49 y.o.   MRN: [id number]  HPI Pt returns for f/u of a large multinodular goiter (dx'ed 2011, in the eval of infection after ovarian surgery; for associated hyperthyroidism, she had i-131 rx twice in 2013; she has been on synthroid since April of 2014; f/u US in 2015 showed the nodules were smaller).  she has slight dysphagia (solid only) sensation in the neck, but no assoc pain. She takes synthroid as rx'ed.  It was increased to 150 mcg/d, approx 1 month ago.  Past Medical History  Diagnosis Date  . Nontoxic multinodular goiter   . Diabetes mellitus   . Hyperlipidemia   . Hypertension     Past Surgical History  Procedure Laterality Date  . Abdominal hysterectomy  2011    History   Social History  . Marital Status: Married    Spouse Name: N/A  . Number of Children: 2  . Years of Education: 12   Occupational History  . Not on file.   Social History Main Topics  . Smoking status: Never Smoker   . Smokeless tobacco: Not on file  . Alcohol Use: No  . Drug Use: No  . Sexual Activity: Not on file   Other Topics Concern  . Not on file   Social History Narrative    Current Outpatient Prescriptions on File Prior to Visit  Medication Sig Dispense Refill  . amitriptyline (ELAVIL) 25 MG tablet   2  . clopidogrel (PLAVIX) 75 MG tablet Take 75 mg by mouth daily.  11  . INVOKANA 100 MG TABS     . Liraglutide (VICTOZA) 18 MG/3ML SOLN Inject 1.2 mg into the skin daily.     . meclizine (ANTIVERT) 25 MG tablet Take 25 mg by mouth 3 (three) times daily as needed for dizziness.    . metFORMIN (GLUCOPHAGE) 500 MG tablet 3 tablets by mouth once daily     . Olmesartan-Amlodipine-HCTZ 40-10-25 MG TABS Take 1 tablet by mouth daily.    . rosuvastatin (CRESTOR) 10 MG tablet Take 10 mg by mouth daily.       No current facility-administered medications on file prior to visit.    No Known Allergies  Family History    Problem Relation Age of Onset  . Cancer Mother     Breast Cancer  . Cancer Father     Father-Thyroid cancer    BP 134/88 mmHg  Pulse 110  Temp(Src) 98.3 F (36.8 C) (Oral)  Ht 5\' 3"  (1.6 m)  Wt 242 lb (109.77 kg)  BMI 42.88 kg/m2  SpO2 92%    Review of Systems Denies weight change and tremor.    Objective:   Physical Exam VITAL SIGNS:  See vs page GENERAL: no distress Neck: thyroid is 5x normal size overall, with multinodular surface. Several nodules are easily palpable    Lab Results  Component Value Date   TSH 2.75 02/08/2015      Assessment & Plan:  Post-I-131 hypothyroidism, well-replaced Multinodular goiter, smaller on US since I-131 rx  Patient is advised the following: Patient Instructions  blood tests are requested for you today.  We'll let you know about the results. Let's plan to recheck the ultrasound in late 2017. Please let Dr Neita CarpSasser or I know if you decide to have the swallowing X-ray. Please come back for a follow-up appointment in 1 year (but please have the blood test recheck in 6 months,  because the thyroid nodules could become overactive again).

## 2015-02-08 NOTE — Patient Instructions (Signed)
blood tests are requested for you today.  We'll let you know about the results. Let's plan to recheck the ultrasound in late 2017. Please let Dr Neita CarpSasser or I know if you decide to have the swallowing X-ray. Please come back for a follow-up appointment in 1 year (but please have the blood test recheck in 6 months, because the thyroid nodules could become overactive again).

## 2016-02-07 ENCOUNTER — Ambulatory Visit: Payer: Self-pay | Admitting: Endocrinology

## 2017-04-09 ENCOUNTER — Ambulatory Visit (INDEPENDENT_AMBULATORY_CARE_PROVIDER_SITE_OTHER): Payer: 59 | Admitting: Otolaryngology

## 2017-04-09 DIAGNOSIS — K1121 Acute sialoadenitis: Secondary | ICD-10-CM

## 2019-07-13 ENCOUNTER — Ambulatory Visit: Payer: 59 | Admitting: Neurology

## 2019-07-13 ENCOUNTER — Encounter: Payer: Self-pay | Admitting: Neurology

## 2019-07-13 ENCOUNTER — Other Ambulatory Visit: Payer: Self-pay

## 2019-07-13 VITALS — BP 142/84 | HR 112 | Temp 97.1°F | Ht 64.0 in | Wt 217.0 lb

## 2019-07-13 DIAGNOSIS — R251 Tremor, unspecified: Secondary | ICD-10-CM

## 2019-07-13 NOTE — Progress Notes (Signed)
Subjective:    [name] ID: Amber [name] is a 53 y.o. female.  HPI     Amber Foley, MD, PhD [facility] [address], [address] [address] [place], [place] [address]  Dear Dr. Neita [name], I saw your [name], Amber [name], upon your kind request in my neurologic clinic today for initial consultation of her tremors.  Amber [name] is accompanied by her husband today.  As you know, Amber [name] is a 53 year old right-handed woman with an underlying medical history of thyroid disease with history of goiter, hypertension, hyperlipidemia, suboptimally controlled diabetes, carpal tunnel syndrome and suspected TIA and right internal carotid artery stenosis, for which she was followed by [facility] neurology for years, obesity and obstructive sleep apnea on CPAP therapy, who reports a new onset tremor since July 2020.  She reports that after she left [facility] hospital left [facility] hospital from her COVID-19 infection in June, she started having a tremor in Amber right hand primarily.  Her husband reports that it comes and goes.  It is noticeable when she tries to type something.  She has no prior history of tremor.  She felt it was fairly sudden in onset, did not have it while in Amber hospital.  Her blood sugar control has improved she reports.  She endorses using her CPAP nightly.  She sleeps fairly well but goes to bed late between 1 and 2 AM typically and rise time currently is around 9.  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  She does hydrate but may over hydrate, estimates that she drinks about 7 bottles of water per day, 16 ounce size.  She reports that she did have some blood work in September, I was not able to review it. I reviewed your office note from 06/15/2019.  Of note, her A1c in June 2020 was 10.5.  This has come down from 12.4 previously.   Of note, she had a brain MRI without contrast at [facility] on 06/30/2014 and I reviewed Amber results: Impression:  1. Negative  for acute infarct. 2. 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 She had a CT angiogram head and neck with and without contrast on 10/16/2014 and I reviewed Amber results:  1. No evidence of acute intracranial abnormality. Although no evidence of acute infarction, mass, or hemorrhage is seen, CT is relatively insensitive for Amber detection of hypoxia/ischemia within Amber first 24-48 hours, and an MRI scan may be indicated. 2. 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 Overall, no significant interval change in comparison to Amber prior head and neck CTA dated 06/29/2014.  Her husband is wondering if Amber tremor could be from being given several different medications while in Amber hospital.I was not able to pull up hospital records unfortunately even in care everywhere.  Her Past Medical History Is Significant For: Past Medical History:  Diagnosis Date  . Diabetes mellitus   . Hyperlipidemia   . Hypertension   . Nontoxic multinodular goiter     Her Past Surgical History Is Significant For: Past Surgical History:  Procedure Laterality Date  . ABDOMINAL HYSTERECTOMY  2011    Her Family History Is Significant For: Family History  Problem Relation Age of Onset  . Cancer Mother        Breast Cancer  . Cancer Father  Father-Thyroid cancer    Her Social History Is Significant For: Social History   Socioeconomic History  . Marital status: Married    Spouse name: Not on file  . Number of children: 2  . Years of education: 76  . Highest education level: Not on file  Occupational History  . Not on file  Social Needs  . Financial resource strain: Not on file  . Food insecurity    Worry: Not on file    Inability: Not on file  . Transportation needs    Medical: Not on file     Non-medical: Not on file  Tobacco Use  . Smoking status: Never Smoker  Substance and Sexual Activity  . Alcohol use: No  . Drug use: No  . Sexual activity: Not on file  Lifestyle  . Physical activity    Days per week: Not on file    Minutes per session: Not on file  . Stress: Not on file  Relationships  . Social Musician on phone: Not on file    Gets together: Not on file    Attends religious service: Not on file    Active member of club or organization: Not on file    Attends meetings of clubs or organizations: Not on file    Relationship status: Not on file  Other Topics Concern  . Not on file  Social History Narrative  . Not on file    Her Allergies Are:  No Known Allergies:   Her Current Medications Are:  Outpatient Encounter Medications as of 07/13/2019  Medication Sig  . amitriptyline (ELAVIL) 50 MG tablet Take 50 mg by mouth at bedtime.  Marland Kitchen amLODipine (NORVASC) 10 MG tablet Take 10 mg by mouth daily.  . clopidogrel (PLAVIX) 75 MG tablet Take 75 mg by mouth daily.  . Dulaglutide (TRULICITY) 1.5 MG/0.5ML SOPN Inject into Amber skin.  Marland Kitchen levothyroxine (SYNTHROID) 200 MCG tablet Take 200 mcg by mouth daily before breakfast.  . levothyroxine (SYNTHROID) 25 MCG tablet Take 25 mcg by mouth daily before breakfast.  . meclizine (ANTIVERT) 25 MG tablet Take 25 mg by mouth 3 (three) times daily as needed for dizziness.  . metFORMIN (GLUCOPHAGE) 500 MG tablet Take 2,000 mg by mouth daily with breakfast. 4 tablets by mouth once daily  . Olmesartan-Amlodipine-HCTZ 40-10-25 MG TABS Take 1 tablet by mouth daily.  . rosuvastatin (CRESTOR) 10 MG tablet Take 10 mg by mouth daily.    . [DISCONTINUED] INVOKANA 100 MG TABS   . [DISCONTINUED] Liraglutide (VICTOZA) 18 MG/3ML SOLN Inject 1.2 mg into Amber skin daily.    No facility-administered encounter medications on file as of 07/13/2019.   :   Review of Systems:  Out of a complete 14 point review of systems, all are reviewed  and negative with Amber exception of these symptoms as listed below:  Review of Systems  Neurological:       Here to discuss right hand tremor. Reports tremor has been present since July. Has notice some of tremors in Amber left hand.     Objective:  Neurological Exam  Physical Exam Physical Examination:   Vitals:   07/13/19 1546  BP: (!) 142/84  Pulse: (!) 112  Temp: (!) 97.1 F (36.2 C)  SpO2: 98%   General Examination: Amber [name] is a very pleasant 53 y.o. female in no acute distress. She appears well-developed and well-nourished and very well groomed.   HEENT: Normocephalic, atraumatic, pupils are equal, round and  reactive to light and accommodation. Funduscopic exam is normal with sharp disc margins noted. Extraocular tracking is good without limitation to gaze excursion or nystagmus noted. Normal smooth pursuit is noted. Hearing is grossly intact. Tympanic membranes are clear bilaterally. Face is symmetric with normal facial animation and normal facial sensation. Speech is clear with no dysarthria noted. There is no hypophonia. There is no lip, neck/head, jaw or voice tremor. Neck is supple with full range of passive and active motion. There are no carotid bruits on auscultation. Oropharynx exam reveals: moderate mouth dryness, adequate dental hygiene and moderate airway crowding. Tongue protrudes centrally and palate elevates symmetrically.   Chest: Clear to auscultation.  Heart: S1+S2+0   Abdomen: Soft, non-tender and non-distended.  Extremities: There is no pitting edema in Amber distal lower extremities bilaterally. Pedal pulses are intact.  Skin: Warm and dry without trophic changes.  Musculoskeletal: exam reveals no obvious joint deformities, tenderness or joint swelling or erythema.   Neurologically:  Mental status: Amber [name] is awake, alert and oriented in all 4 spheres. Her immediate and remote memory, attention, language skills and fund of knowledge are appropriate.  There is no evidence of aphasia, agnosia, apraxia or anomia. Speech is clear with normal prosody and enunciation. Thought process is linear. Mood is normal and affect is normal.  Cranial nerves II - XII are as described above under HEENT exam. In addition: shoulder shrug is normal with equal shoulder height noted. Motor exam: Normal bulk, strength and tone is noted. There is no drift, tremor or rebound. Romberg is negative. Reflexes are 2+ throughout. Fine motor skills and coordination: intact with normal finger taps, normal hand movements, normal rapid alternating patting, normal foot taps and normal foot agility.    On 07/13/2019: On Archimedes spiral drawing she has a very coarse and multidirectional tremor when doing Amber spiral with Amber right hand, she has has no trembling with Amber left hand, handwriting is legible, large, quiet consistently tremulous, not micrographic.  She has an intermittent tremor, it is at times resting tremor, some intention tremor, variable amplitude and frequency, and also entrainment noted. Amber postural component is variable, also a little bit on Amber left side, increase in amplitude and frequency when attention is paid to Amber extremity being tested.  She has normal tone, some giveaway weakness in Amber right upper extremity. Amber tremor direction is also variable, has a rotational component at times. Cerebellar testing: No dysmetria or intention tremor on finger to nose testing. Heel to shin is unremarkable bilaterally. There is no truncal or gait ataxia.  Sensory exam: intact to light touch in Amber upper and lower extremities.  Gait, station and balance: She stands easily. No veering to one side is noted. No leaning to one side is noted. Posture is age-appropriate and stance is narrow based. Gait shows normal stride length and normal pace. No problems turning are noted. Tandem walk is unremarkable.No noticeable tremor when she is doing Amber tandem walk.               Assessment  and Plan:   Assessment and Plan:  In summary, Amber [name] is a very pleasant 53 y.o.-year old female with an underlying medical history of thyroid disease with history of goiter, hypertension, hyperlipidemia, suboptimally controlled diabetes, carpal tunnel syndrome and suspected TIA and right internal carotid artery stenosis, for which she was [facility] for years, obesity and obstructive sleep apnea on CPAP therapy, who presents for evaluation of a new  onset tremor on Amber right side.  She has noticed a tremor since July 2020, it is intermittent.  On examination, she has a variable tremor in Amber right upper extremity, it is variable in amplitude, frequency, and direction.  It is distractible as well.  There is a component of entrainment when examining Amber left hand.  These findings raise Amber possibility of a functional tremor.  She is advised that this tremor is not in keeping with parkinsonian tremor and her neurological exam is not supportive of Parkinson's disease.  She is largely reassured in that regard.  In addition, her history and examination is not in keeping with essential tremor.  We talked about tremor triggers quite a bit today and this includes blood sugar fluctuations, thyroid dysfunction, Dehydration can be a factor, sleep deprivation and stress and anxiety.  She may be over hydrating at times, and this may lead to electrolyte disturbance. She is discouraged from drinking 7 bottles of water per day, 16 ounce size.  She is advised to limit herself to 3-4 bottles per day.  Caffeine intake may be a factor as well.  We will proceed with blood testing and also a brain MRI with and without contrast given her history of TIA.  She is advised that her history and examination are not in keeping with a TIA or stroke.  Nevertheless, we will proceed with a brain scan to rule out a structural cause of her symptoms.  We will keep her posted as to her blood test result and scan results  by phone.  I can see her back on an as-needed basis, so long as her test results are benign. I answered all Amber questions today and Amber [name] and her husband were in agreement. If there is any significant anxiety or stress, she is advised to follow-up with your office. In addition, she is advised to follow-up with you on routine basis as scheduled. Thank you very much for allowing me to participate in Amber care of this nice [name]. If I can be of any further assistance to you please do not hesitate to call me at [phone].  Sincerely,   Star Age, MD, PhD

## 2019-07-13 NOTE — Patient Instructions (Addendum)
It sounds like you may be over hydrating with water, 7 bottles per day can be too much and lead to electrolyte disturbances.  Your tremor does not appear to be a constant tremor, intermittent tremors can be seen in the context of new medication, blood sugar fluctuations, thyroid dysfunction, anxiety and stress.  We will do some blood work today.  Your history and examination do not suggest any primary neurological cause of your tremor, nevertheless, we will do a brain MRI to look for a recent stroke or Structural liver lesion, thankfully, your examination does not suggest a recent stroke.  Your examination does not suggest Parkinson's disease thankfully.So long as your blood test results and brain MRI are benign, I will see you back on an as-needed basis.  Please follow-up with your primary care physician on a regular basis.

## 2019-07-14 ENCOUNTER — Telehealth: Payer: Self-pay

## 2019-07-14 ENCOUNTER — Telehealth: Payer: Self-pay | Admitting: Neurology

## 2019-07-14 LAB — B12 AND FOLATE PANEL
Folate: >20 ng/mL (ref >3.0)
Vitamin B-12: 891 pg/mL (ref 232–1245)

## 2019-07-14 LAB — COMPREHENSIVE METABOLIC PANEL
ALT: 35 IU/L — ABNORMAL HIGH (ref 0–32)
AST: 31 IU/L (ref 0–40)
Albumin/Globulin Ratio: 1.5 (ref 1.2–2.2)
Albumin: 4.6 g/dL (ref 3.8–4.9)
Alkaline Phosphatase: 139 IU/L — ABNORMAL HIGH (ref 39–117)
BUN/Creatinine Ratio: 12 (ref 9–23)
BUN: 13 mg/dL (ref 6–24)
Bilirubin Total: <0.2 mg/dL (ref 0.0–1.2)
CO2: 23 mmol/L (ref 20–29)
Calcium: 10.3 mg/dL — ABNORMAL HIGH (ref 8.7–10.2)
Chloride: 101 mmol/L (ref 96–106)
Creatinine, Ser: 1.11 mg/dL — ABNORMAL HIGH (ref 0.57–1.00)
GFR calc Af Amer: 66 mL/min/{1.73_m2} (ref >59)
GFR calc non Af Amer: 57 mL/min/{1.73_m2} — ABNORMAL LOW (ref >59)
Globulin, Total: 3.1 g/dL (ref 1.5–4.5)
Glucose: 227 mg/dL — ABNORMAL HIGH (ref 65–99)
Potassium: 4.1 mmol/L (ref 3.5–5.2)
Sodium: 139 mmol/L (ref 134–144)
Total Protein: 7.7 g/dL (ref 6.0–8.5)

## 2019-07-14 LAB — TSH: TSH: 0.917 u[IU]/mL (ref 0.450–4.500)

## 2019-07-14 NOTE — Progress Notes (Signed)
Please call patient regarding Her blood work from yesterday, her B12 level and thyroid screening lab called TSH were fine, however, blood sugar level was quite elevatedAt 227.  One of her liver enzymes called alkaline phosphatase was also mildly elevated, that can be seen in the context of taking chronic medication.  However, I do not have any comparison values.  I would recommend that she follow-up with her primary care physician regarding blood sugar control.  Fluctuation in blood sugar levels, as discussed, can contribute to trembling. If she already has a follow-up appointment pending with her primary care physician, she should just keep that, as long as it is coming up in the next Few weeks.

## 2019-07-14 NOTE — Telephone Encounter (Signed)
Notes recorded by Marval Regal, RN on 07/14/2019 at 5:10 PM EST  Left vm for patient to call back on Monday for lab work results.  ------

## 2019-07-14 NOTE — Telephone Encounter (Signed)
no to the covid questions MR Brain w/wo contrast Dr. Rexene Alberts [facility] Auth: [id number] (Exp. 07/14/19 to 08/28/19). Patient is scheduled at [facility] for 07/20/19.

## 2019-07-14 NOTE — Telephone Encounter (Signed)
-----   Message from Star Age, MD sent at 07/14/2019  7:46 AM EST ----- Please call patient regarding [name] blood work from yesterday, [name] B12 level and thyroid screening lab called TSH were fine, however, blood sugar level was quite elevatedAt 227.  One of [name] liver enzymes called alkaline phosphatase was also mildly elevated, that can be seen in the context of taking chronic medication.  However, I do not have any comparison values.  I would recommend that she follow-up with [name] primary care physician regarding blood sugar control.  Fluctuation in blood sugar levels, as discussed, can contribute to trembling. If she already has a follow-up appointment pending with [name] primary care physician, she should just keep that, as long as it is coming up in the next Few weeks.

## 2019-07-18 NOTE — Telephone Encounter (Signed)
I called patient about her blood work levels. I stated her b12 and thyroid level were fine, Her blood sugar was elevated at 227 and to seek PCP for better blood sugar control. Fluctuation in blood sugar levels can contribute to trembling.I explain one of her liver enzymes called alkaline phosphate was mildly elevated but it can be seen in taking chronic medications., and there are no previous values to compare. I stated a copy will be fax to her PCP for evaluation. Pt has an appt with her PCP in January 2021. She verbalized understanding.  ------

## 2019-07-19 NOTE — Telephone Encounter (Signed)
I fax patients lab work to Dr. Consuello Masse her primary doctor at [phone]. Lab work fax twice and reviewed. PT was told to call about liver enzymes and blood glucose being mildly elevated.

## 2019-07-20 ENCOUNTER — Ambulatory Visit: Payer: 59

## 2019-07-20 ENCOUNTER — Other Ambulatory Visit: Payer: Self-pay

## 2019-07-20 DIAGNOSIS — R251 Tremor, unspecified: Secondary | ICD-10-CM

## 2019-07-20 MED ORDER — GADOBENATE DIMEGLUMINE 529 MG/ML IV SOLN
20.0000 mL | Freq: Once | INTRAVENOUS | Status: AC | PRN
  Administered 2019-07-20: 20 mL via INTRAVENOUS

## 2019-07-21 NOTE — Progress Notes (Signed)
Please call patient regarding the recent brain MRI: The brain scan showed a normal structure of the brain and no volume loss or what we call atrophy. There were changes in the deeper structures of the brain, which we call white matter changes or microvascular changes. These were reported as mild in Her case. These are tiny white spots, that occur with time and are seen in a variety of conditions, including with normal aging, chronic hypertension, chronic headaches, especially migraine HAs, chronic diabetes, chronic hyperlipidemia. These are not strokes and no mass or lesion or contrast enhancement was seen which is reassuring. Again, there were no acute findings, such as a stroke, or mass or blood products. No further action is required on this test at this time, other than re-enforcing the importance of good blood pressure control, good cholesterol control, good blood sugar control, and weight management.  No obvious explanation of tremor. As discussed, she can FU with PCP.  Thanks,  Star Age, MD, PhD

## 2019-07-25 ENCOUNTER — Telehealth: Payer: Self-pay

## 2019-07-25 NOTE — Telephone Encounter (Signed)
I called pt and discussed her MRI results. Pt verbalized understanding of results. Pt had no questions at this time but was encouraged to call back if questions arise. Pt will follow up with her PCP.

## 2019-07-25 NOTE — Telephone Encounter (Signed)
-----   Message from Star Age, MD sent at 07/21/2019  6:39 PM EST ----- Please call patient regarding the recent brain MRI: The brain scan showed a normal structure of the brain and no volume loss or what we call atrophy. There were changes in the deeper structures of the brain, which we call white matter changes or microvascular changes. These were reported as mild in Her case. These are tiny white spots, that occur with time and are seen in a variety of conditions, including with normal aging, chronic hypertension, chronic headaches, especially migraine HAs, chronic diabetes, chronic hyperlipidemia. These are not strokes and no mass or lesion or contrast enhancement was seen which is reassuring. Again, there were no acute findings, such as a stroke, or mass or blood products. No further action is required on this test at this time, other than re-enforcing the importance of good blood pressure control, good cholesterol control, good blood sugar control, and weight management.  No obvious explanation of tremor. As discussed, she can FU with PCP.  Thanks,  Star Age, MD, PhD

## 2024-03-02 ENCOUNTER — Encounter: Payer: Self-pay | Admitting: Gastroenterology

## 2024-03-14 ENCOUNTER — Encounter: Payer: Self-pay | Admitting: Gastroenterology

## 2024-03-14 ENCOUNTER — Ambulatory Visit: Admitting: Gastroenterology

## 2024-03-14 VITALS — BP 142/88 | HR 109 | Temp 98.5°F | Ht 64.0 in | Wt 214.4 lb

## 2024-03-14 DIAGNOSIS — Z860109 Personal history of other colon polyps: Secondary | ICD-10-CM

## 2024-03-14 DIAGNOSIS — K760 Fatty (change of) liver, not elsewhere classified: Secondary | ICD-10-CM | POA: Diagnosis not present

## 2024-03-14 DIAGNOSIS — Q859 Phakomatosis, unspecified: Secondary | ICD-10-CM | POA: Insufficient documentation

## 2024-03-14 DIAGNOSIS — K5900 Constipation, unspecified: Secondary | ICD-10-CM

## 2024-03-14 DIAGNOSIS — R772 Abnormality of alphafetoprotein: Secondary | ICD-10-CM

## 2024-03-14 DIAGNOSIS — R7989 Other specified abnormal findings of blood chemistry: Secondary | ICD-10-CM | POA: Insufficient documentation

## 2024-03-14 MED ORDER — LUBIPROSTONE 24 MCG PO CAPS: 24.0000 ug | ORAL_CAPSULE | Freq: Two times a day (BID) | ORAL | 5 refills | Status: AC

## 2024-03-14 NOTE — Progress Notes (Signed)
 GI Office Note    Referring Provider: Atilano Deward ORN, MD Primary Care Physician:  Atilano Deward ORN, MD  Primary Gastroenterologist: Ozell Hollingshead, MD   Chief Complaint   Chief Complaint  Patient presents with   New Patient (Initial Visit)    NAFLD, mild elevation of AFP   History of Present Illness   [name] is a 58 y.o. female presenting today at the request of Dr. 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 Repeat labs with normal values.   Labs 03/01/24: Hgb 13.5, Hct 39.4, WBC 6.3, Plt. 304. EPO 18. AFP 12.  Cre 1.29, tbili 0.3, AST 30, ALT 49, AP 144H,  No Peutz-Jeghers syndrome Tempus germline/herediatray panel neg. Glucose 446  Abd u/s 02/2024: -hepatic steatosis -2 subcentimeter benign appearing hepatic cysts.  -no definite suspicious hepatic lesion -spleen not enlarged  Today: she was diagnosed with DM around 2011. She states at her heaviest she weighed 230 pounds. On Trulicity for 5 years. No FH liver disease. No FH colon cancer. She has been having some constipation for past 2 weeks. BMs ok before that. BM every other day now. Less productive, stools hard. No melena, brbpr. Was having some RLQ (intermittent), last two weeks more diffuse pain. No nausea/vomiting. No heartburn. Occasional dysphagia, solid foods.   Colonoscopy 09/2021: Dr. Maranda -hemorroids on perianal exam -medium polyp cecum, removed hot snare, clips placed, submucosal lipoma -large polyp proximal rectum >1cm in size, removed hot snare, clips, tattooed, hamartomatous polyp, Peutz-Jeghers type -nonbleeding internal hemorrhoids -per Dr. Ricka note, sending for GI for surveillance colonoscopies  Medications   Current Outpatient Medications  Medication Sig Dispense Refill   amitriptyline (ELAVIL) 50 MG tablet Take 50 mg by mouth at  bedtime.     amLODipine (NORVASC) 10 MG tablet Take 10 mg by mouth daily.     clopidogrel (PLAVIX) 75 MG tablet Take 75 mg by mouth daily.  11   Dulaglutide (TRULICITY) 1.5 MG/0.5ML SOPN Inject into the skin.     levothyroxine  (SYNTHROID ) 200 MCG tablet Take 200 mcg by mouth daily before breakfast.     levothyroxine  (SYNTHROID ) 25 MCG tablet Take 25 mcg by mouth daily before breakfast.     meclizine (ANTIVERT) 25 MG tablet Take 25 mg by mouth 3 (three) times daily as needed for dizziness.     metFORMIN (GLUCOPHAGE) 500 MG tablet Take 2,000 mg by mouth daily with breakfast. 4 tablets by mouth once daily     rosuvastatin (CRESTOR) 10 MG tablet Take 10 mg by mouth daily.       No current facility-administered medications for this visit.    Allergies   Allergies as of 03/14/2024   (No Known Allergies)    Past Medical History   Past Medical History:  Diagnosis Date   CVA (cerebral vascular accident) (HCC) 2015   Diabetes mellitus    Hyperlipidemia    Hypertension    Nontoxic multinodular goiter     Past Surgical History   Past Surgical History:  Procedure Laterality Date   ABDOMINAL HYSTERECTOMY  2011    Past Family History   Family History  Problem Relation Age of Onset   Cancer Mother 91 - 11       Breast Cancer   Cancer Father 21       Father-Thyroid  cancer  Breast cancer Sister    Lung cancer Brother        1/2 brother   Prostate cancer Brother    Breast cancer Other     Past Social History   Social History   Socioeconomic History   Marital status: Married    Spouse name: Not on file   Number of children: 2   Years of education: 12   Highest education level: Not on file  Occupational History   Not on file  Tobacco Use   Smoking status: Never   Smokeless tobacco: Not on file  Substance and Sexual Activity   Alcohol use: No   Drug use: No   Sexual activity: Not on file  Other Topics Concern   Not on file  Social History Narrative   Not on file    Social Drivers of Health   Financial Resource Strain: Low Risk  (02/05/2024)   Received from [facility]   Overall Financial Resource Strain (CARDIA)    Difficulty of Paying Living Expenses: Not hard at all  Food Insecurity: No Food Insecurity (02/05/2024)   Received from [facility]   Hunger Vital Sign    Within the past 12 months, you worried that your food would run out before you got the money to buy more.: Never true    Within the past 12 months, the food you bought just didn't last and you didn't have money to get more.: Never true  Transportation Needs: No Transportation Needs (02/05/2024)   Received from [facility] - Transportation    Lack of Transportation (Medical): No    Lack of Transportation (Non-Medical): No  Physical Activity: Insufficiently Active (02/05/2024)   Received from [facility]   Exercise Vital Sign    On average, how many days per week do you engage in moderate to strenuous exercise (like a brisk walk)?: 1 day    On average, how many minutes do you engage in exercise at this level?: 30 min  Stress: No Stress Concern Present (02/05/2024)   Received from [facility] - Occupational Stress Questionnaire    Feeling of Stress : Not at all  Social Connections: Moderately Isolated (02/05/2024)   Received from [facility]   Social Connection and Isolation Panel    In a typical week, how many times do you talk on the phone with family, friends, or neighbors?: Once a week    How often do you get together with friends or relatives?: Once a week    How often do you attend church or religious services?: More than 4 times per year    Do you belong to any clubs or organizations such as church groups, unions, fraternal or athletic groups, or school groups?: No    How often do you attend meetings of the clubs or organizations you belong to?: Never    Are you married, widowed, divorced, separated,  never married, or living with a partner?: Married  Intimate Partner Violence: Not At Risk (02/05/2024)   Received from [facility]   Humiliation, Afraid, Rape, and Kick questionnaire    Within the last year, have you been afraid of your partner or ex-partner?: No    Within the last year, have you been humiliated or emotionally abused in other ways by your partner or ex-partner?: No    Within the last year, have you been kicked, hit, slapped, or otherwise physically hurt  by your partner or ex-partner?: No    Within the last year, have you been raped or forced to have any kind of sexual activity by your partner or ex-partner?: No    Review of Systems   General: Negative for anorexia, weight loss, fever, chills, fatigue, weakness. Eyes: Negative for vision changes.  ENT: Negative for hoarseness, difficulty swallowing , nasal congestion. CV: Negative for chest pain, angina, palpitations, dyspnea on exertion, peripheral edema.  Respiratory: Negative for dyspnea at rest, dyspnea on exertion, cough, sputum, wheezing.  GI: See history of present illness. GU:  Negative for dysuria, hematuria, urinary incontinence, urinary frequency, nocturnal urination.  MS: Negative for joint pain, low back pain.  Derm: Negative for rash or itching.  Neuro: Negative for weakness, abnormal sensation, seizure, frequent headaches, memory loss,  confusion.  Psych: Negative for anxiety, depression, suicidal ideation, hallucinations.  Endo: Negative for unusual weight change.  Heme: Negative for bruising or bleeding. Allergy: Negative for rash or hives.  Physical Exam   BP (!) 142/88   Pulse (!) 109   Temp 98.5 F (36.9 C)   Ht 5' 4 (1.626 m)   Wt 214 lb 6.4 oz (97.3 kg)   BMI 36.80 kg/m    General: Well-nourished, well-developed in no acute distress.  Head: Normocephalic, atraumatic.   Eyes: Conjunctiva pink, no icterus. Mouth: Oropharyngeal mucosa moist and pink  Neck: Supple without thyromegaly,  masses, or lymphadenopathy.  Lungs: Clear to auscultation bilaterally.  Heart: Regular rate and rhythm, no murmurs rubs or gallops.  Abdomen: Bowel sounds are normal,  nondistended, no hepatosplenomegaly or masses,  no abdominal bruits or hernia, no rebound or guarding.  Mild epigastric tenderness. Exam limited by body habitus Rectal: not performed Extremities: No lower extremity edema. No clubbing or deformities.  Neuro: Alert and oriented x 4 , grossly normal neurologically.  Skin: Warm and dry, no rash or jaundice.   Psych: Alert and cooperative, normal mood and affect.  Labs   See hpi  Imaging Studies   No results found.  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 -Low fat/cholesterol diet.   -Avoid sweets, sodas, fruit juices, sweetened beverages like tea, etc. -Gradually increase exercise from 15 min daily up to 1 hr per day 5 days/week. -Limit alcohol use to rare use. -consider MRI liver if AFP persistently elevated at recheck  H/O hamartomatous polyp/Peutz-Jeghers type: -found at time of colonoscopy 09/2021 -Tempus germline/herediatray panel neg for Peutz-Jeghers syndrome per hematologist Dr. Maurita -to determine timing of next colonoscopy.       Sonny RAMAN. Ezzard, MHS, PA-C [facility]

## 2024-03-14 NOTE — Patient Instructions (Addendum)
 Start lubiprostone  24mcg twice daily with food for constipation. Rx sent to [facility]. First week of August, please complete labs at [facility]. We will determine if additional liver imaging needed based on lab results.  I will discuss with Dr. Shaaron regarding when your next colonoscopy will be due. Further recommendations to follow.

## 2024-04-11 LAB — CBC WITH DIFFERENTIAL/PLATELET
Basophils Absolute: 0.1 x10E3/uL (ref 0.0–0.2)
Basos: 1 %
EOS (ABSOLUTE): 0.1 x10E3/uL (ref 0.0–0.4)
Eos: 2 %
Hematocrit: 42.4 % (ref 34.0–46.6)
Hemoglobin: 13.6 g/dL (ref 11.1–15.9)
Immature Grans (Abs): 0 x10E3/uL (ref 0.0–0.1)
Immature Granulocytes: 0 %
Lymphocytes Absolute: 3.1 x10E3/uL (ref 0.7–3.1)
Lymphs: 57 %
MCH: 27.5 pg (ref 26.6–33.0)
MCHC: 32.1 g/dL (ref 31.5–35.7)
MCV: 86 fL (ref 79–97)
Monocytes Absolute: 0.7 x10E3/uL (ref 0.1–0.9)
Monocytes: 13 %
Neutrophils Absolute: 1.4 x10E3/uL (ref 1.4–7.0)
Neutrophils: 27 %
Platelets: 295 x10E3/uL (ref 150–450)
RBC: 4.94 x10E6/uL (ref 3.77–5.28)
RDW: 14.7 % (ref 11.7–15.4)
WBC: 5.3 x10E3/uL (ref 3.4–10.8)

## 2024-04-11 LAB — IRON,TIBC AND FERRITIN PANEL
Ferritin: 190 ng/mL — ABNORMAL HIGH (ref 15–150)
Iron Saturation: 24 % (ref 15–55)
Iron: 64 ug/dL (ref 27–159)
Total Iron Binding Capacity: 272 ug/dL (ref 250–450)
UIBC: 208 ug/dL (ref 131–425)

## 2024-04-11 LAB — COMPREHENSIVE METABOLIC PANEL WITH GFR
ALT: 40 IU/L — ABNORMAL HIGH (ref 0–32)
AST: 30 IU/L (ref 0–40)
Albumin: 4.3 g/dL (ref 3.8–4.9)
Alkaline Phosphatase: 178 IU/L — ABNORMAL HIGH (ref 44–121)
BUN/Creatinine Ratio: 10 (ref 9–23)
BUN: 10 mg/dL (ref 6–24)
Bilirubin Total: 0.2 mg/dL (ref 0.0–1.2)
CO2: 22 mmol/L (ref 20–29)
Calcium: 9.8 mg/dL (ref 8.7–10.2)
Chloride: 101 mmol/L (ref 96–106)
Creatinine, Ser: 1.02 mg/dL — ABNORMAL HIGH (ref 0.57–1.00)
Globulin, Total: 2.8 g/dL (ref 1.5–4.5)
Glucose: 274 mg/dL — ABNORMAL HIGH (ref 70–99)
Potassium: 4.8 mmol/L (ref 3.5–5.2)
Sodium: 137 mmol/L (ref 134–144)
Total Protein: 7.1 g/dL (ref 6.0–8.5)
eGFR: 64 mL/min/1.73 (ref >59)

## 2024-04-11 LAB — PROTIME-INR
INR: 0.9 (ref 0.9–1.2)
Prothrombin Time: 10.3 s (ref 9.1–12.0)

## 2024-04-11 LAB — HEPATITIS C ANTIBODY: Hep C Virus Ab: NONREACTIVE

## 2024-04-11 LAB — TISSUE TRANSGLUTAMINASE, IGA: Transglutaminase IgA: <2 U/mL (ref 0–3)

## 2024-04-11 LAB — HEPATITIS B SURFACE ANTIGEN: Hepatitis B Surface Ag: NEGATIVE

## 2024-04-11 LAB — HEPATITIS B CORE ANTIBODY, TOTAL: Hep B Core Total Ab: NEGATIVE

## 2024-04-11 LAB — IGG, IGA, IGM
IgA/Immunoglobulin A, Serum: 377 mg/dL — ABNORMAL HIGH (ref 87–352)
IgG (Immunoglobin G), Serum: 999 mg/dL (ref 586–1602)
IgM (Immunoglobulin M), Srm: 127 mg/dL (ref 26–217)

## 2024-04-11 LAB — ANA W/REFLEX IF POSITIVE

## 2024-04-11 LAB — MITOCHONDRIAL/SMOOTH MUSCLE AB PNL
Mitochondrial Ab: <20 U (ref 0.0–20.0)
Smooth Muscle Ab: 4 U (ref 0–19)

## 2024-04-11 LAB — HEPATITIS B SURFACE ANTIBODY,QUALITATIVE: Hep B Surface Ab, Qual: NONREACTIVE

## 2024-04-11 LAB — HEPATITIS A ANTIBODY, TOTAL: hep A Total Ab: POSITIVE — AB

## 2024-04-11 LAB — AFP TUMOR MARKER: AFP, Serum, Tumor Marker: 9.9 ng/mL — ABNORMAL HIGH (ref 0.0–9.2)

## 2024-04-24 ENCOUNTER — Ambulatory Visit: Payer: Self-pay | Admitting: Gastroenterology

## 2024-04-25 ENCOUNTER — Other Ambulatory Visit: Payer: Self-pay | Admitting: *Deleted

## 2024-04-25 DIAGNOSIS — R772 Abnormality of alphafetoprotein: Secondary | ICD-10-CM

## 2024-04-25 DIAGNOSIS — R7989 Other specified abnormal findings of blood chemistry: Secondary | ICD-10-CM

## 2024-04-28 ENCOUNTER — Ambulatory Visit (HOSPITAL_COMMUNITY)
Admission: RE | Admit: 2024-04-28 | Discharge: 2024-04-28 | Disposition: A | Discharge status: Home / Self Care | Source: Ambulatory Visit | Attending: Gastroenterology | Admitting: Gastroenterology

## 2024-04-28 DIAGNOSIS — R772 Abnormality of alphafetoprotein: Secondary | ICD-10-CM | POA: Insufficient documentation

## 2024-04-28 DIAGNOSIS — R7989 Other specified abnormal findings of blood chemistry: Secondary | ICD-10-CM | POA: Insufficient documentation

## 2024-04-28 MED ORDER — GADOBUTROL 1 MMOL/ML IV SOLN
10.0000 mL | Freq: Once | INTRAVENOUS | Status: AC | PRN
  Administered 2024-04-28: 10 mL via INTRAVENOUS

## 2024-05-06 ENCOUNTER — Ambulatory Visit: Payer: Self-pay | Admitting: Gastroenterology

## 2024-05-10 ENCOUNTER — Other Ambulatory Visit: Payer: Self-pay

## 2024-05-10 DIAGNOSIS — R7989 Other specified abnormal findings of blood chemistry: Secondary | ICD-10-CM

## 2024-05-10 DIAGNOSIS — R772 Abnormality of alphafetoprotein: Secondary | ICD-10-CM

## 2024-05-19 ENCOUNTER — Other Ambulatory Visit: Payer: Self-pay

## 2024-05-19 DIAGNOSIS — R7989 Other specified abnormal findings of blood chemistry: Secondary | ICD-10-CM

## 2024-05-19 DIAGNOSIS — R772 Abnormality of alphafetoprotein: Secondary | ICD-10-CM

## 2024-06-14 NOTE — Progress Notes (Signed)
 [name]                                          MRN: [id number]   06/14/2024   The [facility] Quality Team Specialist reviewed this patient medical record for the purposes of chart review for care gap closure. The following were reviewed: chart review for care gap closure-kidney health evaluation for diabetes:uACR.    [facility] Quality Team

## 2024-09-08 ENCOUNTER — Encounter: Payer: Self-pay | Admitting: Gastroenterology

## 2024-09-30 NOTE — Progress Notes (Signed)
 BRITTINEE RISK                                          MRN: [id number]   09/30/2024   The [facility] Quality Team Specialist reviewed this patient medical record for the purposes of chart review for care gap closure. The following were reviewed: chart review for care gap closure-glycemic status assessment.    [facility] Quality Team
